# Patient Record
Sex: Male | Born: 1990 | Race: White | Hispanic: No | Marital: Single | Smoking: Never smoker
Health system: Southern US, Community
[De-identification: ages and names within clinical notes are randomized; demographics above are authoritative.]

## PROBLEM LIST (undated history)

## (undated) DIAGNOSIS — J45909 Unspecified asthma, uncomplicated: Secondary | ICD-10-CM

## (undated) DIAGNOSIS — Z889 Allergy status to unspecified drugs, medicaments and biological substances status: Secondary | ICD-10-CM

## (undated) HISTORY — DX: Allergy status to unspecified drugs, medicaments and biological substances: Z88.9

## (undated) HISTORY — PX: NO PAST SURGERIES: SHX2092

## (undated) HISTORY — DX: Unspecified asthma, uncomplicated: J45.909

---
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
## 2016-09-04 DIAGNOSIS — E038 Other specified hypothyroidism: Secondary | ICD-10-CM | POA: Diagnosis not present

## 2016-09-04 DIAGNOSIS — E784 Other hyperlipidemia: Secondary | ICD-10-CM | POA: Diagnosis not present

## 2016-09-04 DIAGNOSIS — Z0000 Encounter for general adult medical examination without abnormal findings: Secondary | ICD-10-CM | POA: Diagnosis not present

## 2016-09-11 DIAGNOSIS — E784 Other hyperlipidemia: Secondary | ICD-10-CM | POA: Diagnosis not present

## 2016-09-11 DIAGNOSIS — Z1389 Encounter for screening for other disorder: Secondary | ICD-10-CM | POA: Diagnosis not present

## 2016-09-11 DIAGNOSIS — R7301 Impaired fasting glucose: Secondary | ICD-10-CM | POA: Diagnosis not present

## 2016-09-11 DIAGNOSIS — Z0000 Encounter for general adult medical examination without abnormal findings: Secondary | ICD-10-CM | POA: Diagnosis not present

## 2016-09-11 DIAGNOSIS — R946 Abnormal results of thyroid function studies: Secondary | ICD-10-CM | POA: Diagnosis not present

## 2017-06-27 ENCOUNTER — Encounter (HOSPITAL_COMMUNITY): Payer: Self-pay | Admitting: Emergency Medicine

## 2017-06-27 ENCOUNTER — Ambulatory Visit (HOSPITAL_COMMUNITY)
Admission: EM | Admit: 2017-06-27 | Discharge: 2017-06-27 | Disposition: A | Discharge status: Home / Self Care | Payer: Commercial Managed Care - PPO | Attending: Emergency Medicine | Admitting: Emergency Medicine

## 2017-06-27 ENCOUNTER — Ambulatory Visit (INDEPENDENT_AMBULATORY_CARE_PROVIDER_SITE_OTHER): Payer: Commercial Managed Care - PPO

## 2017-06-27 DIAGNOSIS — R0781 Pleurodynia: Secondary | ICD-10-CM | POA: Diagnosis not present

## 2017-06-27 MED ORDER — NAPROXEN 500 MG PO TABS: 500.0000 mg | ORAL_TABLET | Freq: Two times a day (BID) | ORAL | 0 refills | Status: AC

## 2017-06-27 NOTE — Discharge Instructions (Signed)
Xray negative for rib fracture. Symptoms could be due to muscle strain. Start naproxen as directed. Warm compresses. This can take up to 3-4 weeks to completely resolve, but should be feeling better each week. Follow up with PCP for further evaluation if symptoms do not improve or resolve. If experiencing worsening of symptoms, chest pain, shortness of breath, trouble breathing, go to the emergency department for further evaluation.

## 2017-06-27 NOTE — ED Triage Notes (Signed)
Pt sts left sided rib pain x 2 weeks worse with cough; pt denies obvious injury

## 2017-06-27 NOTE — ED Provider Notes (Signed)
[facility]    CSN: [id number] Arrival date & time: 06/27/17  1813     History   Chief Complaint Chief Complaint  Patient presents with  . rib pain    HPI Jesse FallChristopher W Wilbourne is a 26 y.o. male.   26 year old male comes in for 2 week history of left sided rib pain that is worse with cough. Denies injury. Has had intermittent coughing throughout the 2 weeks, denies nasal congestion, sore throat, fever. Denies chest pain, shortness of breath, wheezing. Denies abdominal pain, nausea, vomiting. Patient cannot recall what he was doing when the pain first started. He states he recalls feeling the pain after work. Work requires strenuous activity and heavy lifting. States he can feel a bump on his ribs that is tender to palpation. Pain is constant and exacerbated from movement. He has not taken anything for the pain.       History reviewed. No pertinent past medical history.  There are no active problems to display for this patient.   History reviewed. No pertinent surgical history.     Home Medications    Prior to Admission medications   Medication Sig Start Date End Date Taking? Authorizing Provider  naproxen (NAPROSYN) 500 MG tablet Take 1 tablet (500 mg total) by mouth 2 (two) times daily. 06/27/17 07/07/17  Belinda FisherYu, Amy V, PA-C    Family History History reviewed. No pertinent family history.  Social History Social History  Substance Use Topics  . Smoking status: Not on file  . Smokeless tobacco: Never Used  . Alcohol use No     Allergies   Patient has no known allergies.   Review of Systems Review of Systems  Reason unable to perform ROS: See HPI as above.     Physical Exam Triage Vital Signs ED Triage Vitals [06/27/17 1842]  Enc Vitals Group     BP (!) 146/72     Pulse Rate 62     Resp 18     Temp 98.3 F (36.8 C)     Temp Source Oral     SpO2 100 %     Weight      Height      Head Circumference      Peak Flow      Pain Score 8   Pain Loc      Pain Edu?      Excl. in GC?    No data found.   Updated Vital Signs BP (!) 146/72 (BP Location: Right Arm)   Pulse 62   Temp 98.3 F (36.8 C) (Oral)   Resp 18   SpO2 100%   Physical Exam  Constitutional: He is oriented to person, place, and time. He appears well-developed and well-nourished. No distress.  HENT:  Head: Normocephalic and atraumatic.  Right Ear: Tympanic membrane, external ear and ear canal normal. Tympanic membrane is not erythematous and not bulging.  Left Ear: Tympanic membrane, external ear and ear canal normal. Tympanic membrane is not erythematous and not bulging.  Nose: Nose normal. Right sinus exhibits no maxillary sinus tenderness and no frontal sinus tenderness. Left sinus exhibits no maxillary sinus tenderness and no frontal sinus tenderness.  Mouth/Throat: Uvula is midline, oropharynx is clear and moist and mucous membranes are normal.  Eyes: Pupils are equal, round, and reactive to light. Conjunctivae are normal.  Neck: Normal range of motion. Neck supple.  Cardiovascular: Normal rate, regular rhythm and normal heart sounds.  Exam reveals no gallop and no  friction rub.   No murmur heard. Pulmonary/Chest: Effort normal and breath sounds normal. He has no decreased breath sounds. He has no wheezes. He has no rhonchi. He has no rales. He exhibits tenderness (No bruising, obvious swelling noted. Left anterior chest at about 6th rib. With small deformity felt. ).  Abdominal: Soft. Bowel sounds are normal. He exhibits no mass. There is no tenderness. There is no rebound and no guarding.  Lymphadenopathy:    He has no cervical adenopathy.  Neurological: He is alert and oriented to person, place, and time.  Skin: Skin is warm and dry.  Psychiatric: He has a normal mood and affect. His behavior is normal. Judgment normal.     UC Treatments / Results  Labs (all labs ordered are listed, but only abnormal results are displayed) Labs Reviewed - No  data to display  EKG  EKG Interpretation None       Radiology Dg Ribs Unilateral W/chest Left  Addendum Date: 06/27/2017   ADDENDUM REPORT: 06/27/2017 19:28 ADDENDUM: The third portion of the findings should read as follows: Normal appearing ribs. Electronically Signed   By: Beckie Salts M.D.   On: 06/27/2017 19:28   Result Date: 06/27/2017 CLINICAL DATA:  Left anterior rib pain and deformity without known injury. EXAM: LEFT RIBS AND CHEST - 3+ VIEW COMPARISON:  None. FINDINGS: Normal sized heart.  Clear lungs.  Normal appearing hips. IMPRESSION: Normal examination. Electronically Signed: By: Beckie Salts M.D. On: 06/27/2017 19:07    Procedures Procedures (including critical care time)  Medications Ordered in UC Medications - No data to display   Initial Impression / Assessment and Plan / UC Course  I have reviewed the triage vital signs and the nursing notes.  Pertinent labs & imaging results that were available during my care of the patient were reviewed by me and considered in my medical decision making (see chart for details).    Xray negative for rib fracture. Will treat for muscle strain/pain. NSAIDs as directed. Patient to follow up with PCP if symptoms do not resolve. Return precautions given.   Final Clinical Impressions(s) / UC Diagnoses   Final diagnoses:  Rib pain on left side    New Prescriptions Discharge Medication List as of 06/27/2017  7:18 PM    START taking these medications   Details  naproxen (NAPROSYN) 500 MG tablet Take 1 tablet (500 mg total) by mouth 2 (two) times daily., Starting Fri 06/27/2017, Until Mon 07/07/2017, Normal          Linward Headland V, PA-C 06/27/17 1957

## 2017-07-02 ENCOUNTER — Other Ambulatory Visit: Payer: Self-pay | Admitting: Internal Medicine

## 2017-07-02 DIAGNOSIS — R0781 Pleurodynia: Secondary | ICD-10-CM | POA: Diagnosis not present

## 2017-07-07 ENCOUNTER — Ambulatory Visit
Admission: RE | Admit: 2017-07-07 | Discharge: 2017-07-07 | Disposition: A | Discharge status: Home / Self Care | Payer: Commercial Managed Care - PPO | Source: Ambulatory Visit | Attending: Internal Medicine | Admitting: Internal Medicine

## 2017-07-07 DIAGNOSIS — S2232XA Fracture of one rib, left side, initial encounter for closed fracture: Secondary | ICD-10-CM | POA: Diagnosis not present

## 2017-07-07 DIAGNOSIS — R0781 Pleurodynia: Secondary | ICD-10-CM

## 2017-07-07 MED ORDER — IOPAMIDOL (ISOVUE-300) INJECTION 61%
75.0000 mL | Freq: Once | INTRAVENOUS | Status: AC | PRN
  Administered 2017-07-07: 75 mL via INTRAVENOUS

## 2017-08-13 ENCOUNTER — Other Ambulatory Visit (HOSPITAL_COMMUNITY): Payer: Self-pay | Admitting: Internal Medicine

## 2017-08-13 DIAGNOSIS — R9389 Abnormal findings on diagnostic imaging of other specified body structures: Secondary | ICD-10-CM

## 2017-08-13 DIAGNOSIS — R0781 Pleurodynia: Secondary | ICD-10-CM

## 2017-08-21 ENCOUNTER — Encounter (HOSPITAL_COMMUNITY)
Admission: RE | Admit: 2017-08-21 | Discharge: 2017-08-21 | Disposition: A | Discharge status: Home / Self Care | Payer: Commercial Managed Care - PPO | Source: Ambulatory Visit | Attending: Internal Medicine | Admitting: Internal Medicine

## 2017-08-21 DIAGNOSIS — R0781 Pleurodynia: Secondary | ICD-10-CM

## 2017-08-21 DIAGNOSIS — R9389 Abnormal findings on diagnostic imaging of other specified body structures: Secondary | ICD-10-CM | POA: Insufficient documentation

## 2017-08-21 MED ORDER — TECHNETIUM TC 99M MEDRONATE IV KIT
25.0000 | PACK | Freq: Once | INTRAVENOUS | Status: AC | PRN
  Administered 2017-08-21: 25 via INTRAVENOUS

## 2017-08-29 DIAGNOSIS — Z0000 Encounter for general adult medical examination without abnormal findings: Secondary | ICD-10-CM | POA: Diagnosis not present

## 2017-08-29 DIAGNOSIS — R7301 Impaired fasting glucose: Secondary | ICD-10-CM | POA: Diagnosis not present

## 2017-08-29 DIAGNOSIS — R82998 Other abnormal findings in urine: Secondary | ICD-10-CM | POA: Diagnosis not present

## 2017-09-16 DIAGNOSIS — E7849 Other hyperlipidemia: Secondary | ICD-10-CM | POA: Diagnosis not present

## 2017-09-16 DIAGNOSIS — R7301 Impaired fasting glucose: Secondary | ICD-10-CM | POA: Diagnosis not present

## 2017-09-16 DIAGNOSIS — Z0000 Encounter for general adult medical examination without abnormal findings: Secondary | ICD-10-CM | POA: Diagnosis not present

## 2017-09-16 DIAGNOSIS — R0781 Pleurodynia: Secondary | ICD-10-CM | POA: Diagnosis not present

## 2017-09-16 DIAGNOSIS — Z23 Encounter for immunization: Secondary | ICD-10-CM | POA: Diagnosis not present

## 2017-09-16 DIAGNOSIS — Z1389 Encounter for screening for other disorder: Secondary | ICD-10-CM | POA: Diagnosis not present

## 2017-10-02 ENCOUNTER — Encounter: Payer: Commercial Managed Care - PPO | Admitting: Cardiothoracic Surgery

## 2017-10-08 ENCOUNTER — Institutional Professional Consult (permissible substitution): Payer: Commercial Managed Care - PPO | Admitting: Cardiothoracic Surgery

## 2017-10-08 ENCOUNTER — Encounter: Payer: Self-pay | Admitting: Cardiothoracic Surgery

## 2017-10-08 VITALS — BP 130/86 | HR 80 | Resp 20 | Ht 70.0 in | Wt 172.0 lb

## 2017-10-08 DIAGNOSIS — R0781 Pleurodynia: Secondary | ICD-10-CM

## 2017-10-08 NOTE — Progress Notes (Signed)
[address]       [place] [address]             [phone]                    Marco Johnson [facility] Medical Record #[id number] Date of Birth: Nov 18, 1990  Referring: Martha Clan, MD Primary Care: Martha Clan, MD Primary Cardiologist: No primary care provider on file.  Chief Complaint:    Chief Complaint  Patient presents with  . Chest Pain    Surgical eval, Whole body scan 08/21/17, Chest CT 07/07/17    History of Present Illness:    Marco Johnson 27 y.o. male is seen in the office  today for left lower chest rib pain.  The patient denies any known specific trauma to the area.  Patient was seen in the fall 2018 primary care complaining of some tenderness soreness in question of a knot area over the left lower rib the pain does not radiate.  CT scan and bone scan were done previously and suggested rib fracture.  The patient has continued to notice discomfort but really has not gone away over several months.  Patient did not feel any popping or clicking.  He has been doing physical work with a Dealer.   He does have a family history in his mother with a sarcoma in her leg years ago   Current Activity/ Functional Status:  Patient is independent with mobility/ambulation, transfers, ADL's, IADL's.   Zubrod Score: At the time of surgery this patient's most appropriate activity status/level should be described as: [x]     0    Normal activity, no symptoms []     1    Restricted in physical strenuous activity but ambulatory, able to do out light work []     2    Ambulatory and capable of self care, unable to do work activities, up and about               >50 % of waking hours                              []     3    Only limited self care, in bed greater than 50% of waking hours []     4    Completely disabled, no self care, confined to bed or chair []     5    Moribund   Past Medical History:  Diagnosis Date  . Asthma   . H/O  seasonal allergies     Past Surgical History:  Procedure Laterality Date  . NO PAST SURGERIES      Family History  Problem Relation Age of Onset  . Cancer Mother   . Diabetes Father     Social History   Socioeconomic History  . Marital status: Single    Spouse name: Not on file  . Number of children: Not on file  . Years of education: Not on file  . Highest education level: Not on file  Social Needs  . Financial resource strain: Not on file  . Food insecurity - worry: Not on file  . Food insecurity - inability: Not on file  . Transportation needs - medical: Not on file  . Transportation needs - non-medical: Not on file  Occupational History  . Occupation: Emergency planning/management officer  Tobacco Use  . Smoking status: Never Smoker  .  Smokeless tobacco: Never Used  Substance and Sexual Activity  . Alcohol use: Yes  . Drug use: No  . Sexual activity: Not on file  Other Topics Concern  . Not on file  Social History Narrative  . Not on file    Social History   Tobacco Use  Smoking Status Never Smoker  Smokeless Tobacco Never Used    Social History   Substance and Sexual Activity  Alcohol Use Yes     No Known Allergies  Current Outpatient Medications  Medication Sig Dispense Refill  . amphetamine-dextroamphetamine (ADDERALL) 10 MG tablet Take 10 mg by mouth 2 (two) times daily as needed.    Marland Kitchen VYVANSE 60 MG capsule      No current facility-administered medications for this visit.     Pertinent items are noted in HPI.   Review of Systems:     Cardiac Review of Systems: [Y] = yes  or   [  ] = no   Chest Pain [rib pain    ]  Resting SOB [ n  ] Exertional SOB  [n  ]  Orthopnea [ n ]   Pedal Edema [n   ]    Palpitations [ n ] Syncope  [n  ]   Presyncope [ n  ]  General Review of Systems: [Y] = yes [  ]=no Constitional: recent weight change [ n ];  Wt loss over the last 3 months [ n  ] anorexia [n  ]; fatigue [ n ]; nausea [  n]; night sweats [ n]; fever [n  ]; or chills  [ n ];          Dental: poor dentition[  ]; Last Dentist visit:   Eye : blurred vision [  ]; diplopia [   ]; vision changes [  ];  Amaurosis fugax[  ]; Resp: cough [ n ];  wheezing[n  ];  hemoptysis[n  ]; shortness of breath[ n ]; paroxysmal nocturnal dyspnea[  n]; dyspnea on exertion[  ]; or orthopnea[  ];  GI:  gallstones[n  ], vomiting[  ];  dysphagia[  ]; melena[  ];  hematochezia [  ]; heartburn[  ];   Hx of  Colonoscopy[  ]; GU: kidney stones [  ]; hematuria[ n ];   dysuria [n  ];  nocturia[  ];  history of     obstruction [  ]; urinary frequency [  ]             Skin: rash, swelling[n  ];, hair loss[ n ];  peripheral edema[ n ];  or itching[  ]; Musculosketetal: myalgias[ n ];  joint swelling[n  ];  joint erythema[n  ];  joint pain[  ];  back pain[  n];  Heme/Lymph: bruising[  ];  bleeding[  ];  anemia[  ];  Neuro: TIA[ n ];  headaches[n  ];  stroke[ n ];  vertigo[ n ];  seizures[ n ];   paresthesias[  ];  difficulty walking[  ];  Psych:depression[ n ]; anxiety[n  ];  Endocrine: diabetes[n  ];  thyroid dysfunction[ n ];  Immunizations: Flu up to date [  y]; Pneumococcal up to date [n  ];  Other:  Physical Exam: BP 130/86   Pulse 80   Resp 20   Ht 5\' 10"  (1.778 m)   Wt 172 lb (78 kg)   SpO2 99% Comment: RA  BMI 24.68 kg/m   PHYSICAL EXAMINATION: General appearance: alert and cooperative Head: Normocephalic,  without obvious abnormality, atraumatic Neck: no adenopathy, no carotid bruit, no JVD, supple, symmetrical, trachea midline and thyroid not enlarged, symmetric, no tenderness/mass/nodules Lymph nodes: Cervical, supraclavicular, and axillary nodes normal. Resp: clear to auscultation bilaterally Back: symmetric, no curvature. ROM normal. No CVA tenderness. Cardio: regular rate and rhythm, S1, S2 normal, no murmur, click, rub or gallop GI: soft, non-tender; bowel sounds normal; no masses,  no organomegaly Extremities: extremities normal, atraumatic, no cyanosis or  edema Neurologic: Grossly normal On examination of the rib cage there is no definite mass or movement appreciated in the rib  Diagnostic Studies & Laboratory data:   CLINICAL DATA:  27 y/o male with 3-4 weeks of left side rib pain and palpable abnormality. No known injury. Subsequent encounter.  EXAM: CT CHEST WITH CONTRAST  TECHNIQUE: Multidetector CT imaging of the chest was performed during intravenous contrast administration.  CONTRAST:  75mL ISOVUE-300 IOPAMIDOL (ISOVUE-300) INJECTION 61%  COMPARISON:  06/27/2017 chest and left rib radiographs.  FINDINGS: Cardiovascular: No significant vascular findings. Normal heart size. No pericardial effusion.  Mediastinum/Nodes: No enlarged mediastinal, hilar, or axillary lymph nodes. Thyroid gland, trachea, and esophagus demonstrate no significant findings.  Lungs/Pleura: Major airways are patent. Both lungs are clear. No pleural effusion. Incidental small right lower low 12 mm no mass cyst (series 5, image 75), normal variant).  Upper Abdomen: Circumscribed hypodense area in the right hepatic lobe on series 2, image 144 has density above that of simple fluid. This is probably a benign hemangioma. Other visible upper abdominal viscera appear normal, including in the left upper quadrant. Normal spleen. No upper abdominal free fluid.  Musculoskeletal: Palpable area of concern marked along the left lower ribs corresponding to the left sixth costochondral margin. There is no soft tissue mass or inflammation in the region of the skin marker. However, just caudal to the skin marker there is a discontinuity at the left anterior seventh costochondral junction (series 2 image 153 and series 5, image 153) which demonstrates some dystrophic calcification and appears mildly raised such as this would likely be palpable. Still there is no inflammation in the region.  Underlying normal bone mineralization. No displaced osseous  rib fracture. Intact sternum. Negative visualized spinal vertebrae.  IMPRESSION: 1. Evidence of a left anterior seventh rib costochondral cartilage fracture, appearing subacute or chronic. Legrand RamsFavor this corresponds to the palpable area of concern. No osseous rib fracture or other regional costochondral or chest wall abnormality identified. 2. A 12 mm hypodense area in the posterior right hepatic lobe is most likely benign, such as a hemangioma. Right Upper Quadrant Ultrasound may be the simplest way to confirm. 3. Otherwise negative CT appearance of the chest.   Electronically Signed   By: Odessa FlemingH  Hall M.D.   On: 07/07/2017 09:53  ADDENDUM REPORT: 06/27/2017 19:28  ADDENDUM: The third portion of the findings should read as follows: Normal appearing ribs.   Electronically Signed   By: Beckie SaltsSteven  Reid M.D.   On: 06/27/2017 19:28   Addended by Gordan Paymenteid, Steve, MD on 06/27/2017 7:30 PM    Study Result   CLINICAL DATA:  Left anterior rib pain and deformity without known injury.  EXAM: LEFT RIBS AND CHEST - 3+ VIEW  COMPARISON:  None.  FINDINGS: Normal sized heart.  Clear lungs.  Normal appearing hips.  IMPRESSION: Normal examination.  Electronically Signed: By: Beckie SaltsSteven  Reid M.D. On: 06/27/2017 19:07      CLINICAL DATA:  27 year old male with left anterior rib pain. Abnormal chest CT demonstrating left anterior  seventh rib costochondral cartilage fracture.  EXAM: NUCLEAR MEDICINE WHOLE BODY BONE SCAN  TECHNIQUE: Whole body anterior and posterior images were obtained approximately 3 hours after intravenous injection of radiopharmaceutical.  RADIOPHARMACEUTICALS:  21.3 mCi Technetium-80m MDP IV  COMPARISON:  07/07/2017 chest CT  FINDINGS: A small focal area of increased activity in the region of the anterior left seventh rib is compatible with subacute to remote costochondral fracture as identified on recent CT.  There is minimal slight increased  activity within the proximal left fibula which may represent physiologic uptake or very mild stress reaction.  No other abnormalities are noted.  IMPRESSION: 1. Small area of increased activity in the region of the anterior left seventh rib compatible with subacute to remote costochondral fracture as identified on recent CT. 2. Equivocal minimal increased activity within the proximal left fibula -question physiologic uptake or very mild stress reaction. Correlate with pain.   Electronically Signed   By: Harmon Pier M.D.   On: 08/21/2017 17:30  I have independently reviewed the above radiology studies  and reviewed the findings with the patient.     Recent Lab Findings: No results found for: WBC, HGB, HCT, PLT, GLUCOSE, CHOL, TRIG, HDL, LDLDIRECT, LDLCALC, ALT, AST, NA, K, CL, CREATININE, BUN, CO2, TSH, INR, GLUF, HGBA1C    Assessment / Plan:   Several months of left lower rib pain with a small area of increased uptake on bone scan, and evidence of costochondral fracture on the left on CT scan done in November 2018.  This on the scan that did not appeared to be in a enlarging mass suspicious for chondroma or chondrosarcoma.  I reviewed the films with the patient would not recommend any interventional procedure at this point, we discussed having a follow-up limited CT scan of the area and concern approximately 3 months after the 3-4 months after the previous scan which would be late February. Patient is agreeable with this approach and will return with a limited CT scan With area in question marked in several weeks        Delight Ovens MD      [address] [place].[address] [place] [address] Office [phone]   Beeper [phone]  10/08/2017 4:40 PM

## 2017-10-09 ENCOUNTER — Other Ambulatory Visit: Payer: Self-pay | Admitting: *Deleted

## 2017-10-09 ENCOUNTER — Encounter: Payer: Commercial Managed Care - PPO | Admitting: Cardiothoracic Surgery

## 2017-10-09 DIAGNOSIS — R079 Chest pain, unspecified: Secondary | ICD-10-CM

## 2017-10-28 ENCOUNTER — Encounter: Payer: Commercial Managed Care - PPO | Admitting: Cardiothoracic Surgery

## 2017-11-06 ENCOUNTER — Other Ambulatory Visit: Payer: Self-pay

## 2017-11-06 ENCOUNTER — Ambulatory Visit
Admission: RE | Admit: 2017-11-06 | Discharge: 2017-11-06 | Disposition: A | Discharge status: Home / Self Care | Payer: Commercial Managed Care - PPO | Source: Ambulatory Visit | Attending: Cardiothoracic Surgery | Admitting: Cardiothoracic Surgery

## 2017-11-06 ENCOUNTER — Encounter: Payer: Self-pay | Admitting: Cardiothoracic Surgery

## 2017-11-06 ENCOUNTER — Ambulatory Visit: Payer: Commercial Managed Care - PPO | Admitting: Cardiothoracic Surgery

## 2017-11-06 ENCOUNTER — Other Ambulatory Visit: Payer: Self-pay | Admitting: Cardiothoracic Surgery

## 2017-11-06 VITALS — BP 118/75 | HR 62 | Resp 18 | Ht 70.0 in | Wt 181.0 lb

## 2017-11-06 DIAGNOSIS — R079 Chest pain, unspecified: Secondary | ICD-10-CM

## 2017-11-06 DIAGNOSIS — R0781 Pleurodynia: Secondary | ICD-10-CM | POA: Diagnosis not present

## 2017-11-06 NOTE — Progress Notes (Signed)
[address]       [place] [address]             [phone]                    Marco Johnson [facility] Medical Record #[id number] Date of Birth: [birth year]  Referring: Martha Clan, MD Primary Care: Martha Clan, MD Primary Cardiologist: No primary care provider on file.  Chief Complaint:    Chief Complaint  Patient presents with  . Chest Pain    f/u with chest CT today    History of Present Illness:    Marco Johnson 27 y.o. male is seen in the office  today for left lower chest rib pain.  A follow-up CT scan limited of the chest was done . the patient denies any known specific trauma to the area.  Patient was seen in the fall 2018 primary care complaining of some tenderness soreness in question of a knot area over the left lower rib the pain does not radiate.  CT scan and bone scan were done previously and suggested rib fracture.  The patient has continued to notice discomfort but really has not gone away over several months.  Patient did not feel any popping or clicking.  He has been doing physical work with a Deal[employer].   Since last seen patient still has discomfort over the left costal chondral junctions he notes that he has not disappeared but has not gotten worse either.   He does have a family history in his mother with a sarcoma in her leg years ago   Current Activity/ Functional Status:  Patient is independent with mobility/ambulation, transfers, ADL's, IADL's.   Zubrod Score: At the time of surgery this patient's most appropriate activity status/level should be described as: [x]     0    Normal activity, no symptoms []     1    Restricted in physical strenuous activity but ambulatory, able to do out light work []     2    Ambulatory and capable of self care, unable to do work activities, up and about               >50 % of waking hours                              []     3    Only limited self care, in bed greater than  50% of waking hours []     4    Completely disabled, no self care, confined to bed or chair []     5    Moribund   Past Medical History:  Diagnosis Date  . Asthma   . H/O seasonal allergies     Past Surgical History:  Procedure Laterality Date  . NO PAST SURGERIES      Family History  Problem Relation Age of Onset  . Cancer Mother   . Diabetes Father     Social History   Socioeconomic History  . Marital status: Single    Spouse name: Not on file  . Number of children: Not on file  . Years of education: Not on file  . Highest education level: Not on file  Social Needs  . Financial resource strain: Not on file  . Food insecurity - worry: Not on file  . Food insecurity - inability: Not on file  .  Transportation needs - medical: Not on file  . Transportation needs - non-medical: Not on file  Occupational History  . Occupation: Emergency planning/management officer  Tobacco Use  . Smoking status: Never Smoker  . Smokeless tobacco: Never Used  Substance and Sexual Activity  . Alcohol use: Yes  . Drug use: No  . Sexual activity: Not on file  Other Topics Concern  . Not on file  Social History Narrative  . Not on file    Social History   Tobacco Use  Smoking Status Never Smoker  Smokeless Tobacco Never Used    Social History   Substance and Sexual Activity  Alcohol Use Yes     No Known Allergies  Current Outpatient Medications  Medication Sig Dispense Refill  . amphetamine-dextroamphetamine (ADDERALL) 10 MG tablet Take 10 mg by mouth 2 (two) times daily as needed.    Marland Kitchen VYVANSE 60 MG capsule      No current facility-administered medications for this visit.     Pertinent items are noted in HPI.   Review of Systems:  Review of Systems  Constitutional: Negative.   HENT: Negative.   Eyes: Negative.   Respiratory: Negative.   Cardiovascular: Positive for chest pain. Negative for palpitations, orthopnea, claudication, leg swelling and PND.  Gastrointestinal: Negative.     Genitourinary: Negative.   Musculoskeletal: Negative.   Skin: Negative.   Neurological: Negative.   Endo/Heme/Allergies: Negative.   Psychiatric/Behavioral: Negative.     Immunizations: Flu up to date [  y]; Pneumococcal up to date [n  ];    PHYSICAL EXAMINATION: BP 118/75 (BP Location: Right Arm, Patient Position: Sitting, Cuff Size: Large)   Pulse 62   Resp 18   Ht 5\' 10"  (1.778 m)   Wt 181 lb (82.1 kg)   SpO2 99% Comment: RA  BMI 25.97 kg/m  General appearance: alert, cooperative, appears stated age and no distress Head: Normocephalic, without obvious abnormality, atraumatic Neck: no adenopathy, no carotid bruit, no JVD, supple, symmetrical, trachea midline and thyroid not enlarged, symmetric, no tenderness/mass/nodules Lymph nodes: Cervical, supraclavicular, and axillary nodes normal. Resp: clear to auscultation bilaterally Back: symmetric, no curvature. ROM normal. No CVA tenderness. Cardio: regular rate and rhythm, S1, S2 normal, no murmur, click, rub or gallop GI: soft, non-tender; bowel sounds normal; no masses,  no organomegaly Extremities: extremities normal, atraumatic, no cyanosis or edema Neurologic: Grossly normal  On examination of the rib cage there is no definite mass or movement appreciated in the rib  Diagnostic Studies & Laboratory data:  Ct Chest Limited Wo Contrast  Result Date: 11/06/2017 CLINICAL DATA:  Chest pain, evaluate for osteochondritis of left lower ribs. EXAM: CT CHEST WITHOUT CONTRAST TECHNIQUE: Multidetector CT imaging of the lower chest was performed without IV contrast. COMPARISON:  07/07/2017 FINDINGS: Cardiovascular: Heart is normal size. Visualized aorta is normal caliber. Mediastinum/Nodes: No adenopathy in the lower mediastinum or hila. Lungs/Pleura: Visualized mid and lower lungs are clear. No effusions. Upper Abdomen: Imaging into the upper abdomen shows no acute findings. Musculoskeletal: No acute bony abnormality. No abnormality  noted in the anterior left lower ribs. Costochondral junctions have a normal CT appearance. IMPRESSION: No acute cardiopulmonary disease. No visible focal rib lesion or acute bony abnormality. Electronically Signed   By: Charlett Nose M.D.   On: 11/06/2017 11:36   I have independently reviewed the above radiology studies  and reviewed the findings with the patient.     CLINICAL DATA:  27 y/o male with 3-4 weeks of left side  rib pain and palpable abnormality. No known injury. Subsequent encounter.  EXAM: CT CHEST WITH CONTRAST  TECHNIQUE: Multidetector CT imaging of the chest was performed during intravenous contrast administration.  CONTRAST:  75mL ISOVUE-300 IOPAMIDOL (ISOVUE-300) INJECTION 61%  COMPARISON:  06/27/2017 chest and left rib radiographs.  FINDINGS: Cardiovascular: No significant vascular findings. Normal heart size. No pericardial effusion.  Mediastinum/Nodes: No enlarged mediastinal, hilar, or axillary lymph nodes. Thyroid gland, trachea, and esophagus demonstrate no significant findings.  Lungs/Pleura: Major airways are patent. Both lungs are clear. No pleural effusion. Incidental small right lower low 12 mm no mass cyst (series 5, image 75), normal variant).  Upper Abdomen: Circumscribed hypodense area in the right hepatic lobe on series 2, image 144 has density above that of simple fluid. This is probably a benign hemangioma. Other visible upper abdominal viscera appear normal, including in the left upper quadrant. Normal spleen. No upper abdominal free fluid.  Musculoskeletal: Palpable area of concern marked along the left lower ribs corresponding to the left sixth costochondral margin. There is no soft tissue mass or inflammation in the region of the skin marker. However, just caudal to the skin marker there is a discontinuity at the left anterior seventh costochondral junction (series 2 image 153 and series 5, image 153) which demonstrates  some dystrophic calcification and appears mildly raised such as this would likely be palpable. Still there is no inflammation in the region.  Underlying normal bone mineralization. No displaced osseous rib fracture. Intact sternum. Negative visualized spinal vertebrae.  IMPRESSION: 1. Evidence of a left anterior seventh rib costochondral cartilage fracture, appearing subacute or chronic. Legrand RamsFavor this corresponds to the palpable area of concern. No osseous rib fracture or other regional costochondral or chest wall abnormality identified. 2. A 12 mm hypodense area in the posterior right hepatic lobe is most likely benign, such as a hemangioma. Right Upper Quadrant Ultrasound may be the simplest way to confirm. 3. Otherwise negative CT appearance of the chest.   Electronically Signed   By: Odessa FlemingH  Hall M.D.   On: 07/07/2017 09:53  ADDENDUM REPORT: 06/27/2017 19:28  ADDENDUM: The third portion of the findings should read as follows: Normal appearing ribs.   Electronically Signed   By: Beckie SaltsSteven  Reid M.D.   On: 06/27/2017 19:28   Addended by Gordan Paymenteid, Steve, MD on 06/27/2017 7:30 PM    Study Result   CLINICAL DATA:  Left anterior rib pain and deformity without known injury.  EXAM: LEFT RIBS AND CHEST - 3+ VIEW  COMPARISON:  None.  FINDINGS: Normal sized heart.  Clear lungs.  Normal appearing hips.  IMPRESSION: Normal examination.  Electronically Signed: By: Beckie SaltsSteven  Reid M.D. On: 06/27/2017 19:07      CLINICAL DATA:  27 year old male with left anterior rib pain. Abnormal chest CT demonstrating left anterior seventh rib costochondral cartilage fracture.  EXAM: NUCLEAR MEDICINE WHOLE BODY BONE SCAN  TECHNIQUE: Whole body anterior and posterior images were obtained approximately 3 hours after intravenous injection of radiopharmaceutical.  RADIOPHARMACEUTICALS:  21.3 mCi Technetium-3453m MDP IV  COMPARISON:  07/07/2017 chest CT  FINDINGS: A small  focal area of increased activity in the region of the anterior left seventh rib is compatible with subacute to remote costochondral fracture as identified on recent CT.  There is minimal slight increased activity within the proximal left fibula which may represent physiologic uptake or very mild stress reaction.  No other abnormalities are noted.  IMPRESSION: 1. Small area of increased activity in the region of the anterior  left seventh rib compatible with subacute to remote costochondral fracture as identified on recent CT. 2. Equivocal minimal increased activity within the proximal left fibula -question physiologic uptake or very mild stress reaction. Correlate with pain.   Electronically Signed   By: Harmon Pier M.D.   On: 08/21/2017 17:30  I have independently reviewed the above radiology studies  and reviewed the findings with the patient.     Recent Lab Findings: No results found for: WBC, HGB, HCT, PLT, GLUCOSE, CHOL, TRIG, HDL, LDLDIRECT, LDLCALC, ALT, AST, NA, K, CL, CREATININE, BUN, CO2, TSH, INR, GLUF, HGBA1C    Assessment / Plan:   Several months of left lower rib pain with a small area of increased uptake on bone scan, and evidence of costochondral fracture on the left on CT scan done in November 2018.  Follow-up limited scan now done, 5 months later shows no abnormality in the area of discomfort.  At this point we we will not plan any specific surgical intervention.  I will see the patient back in 3 months for follow-up visit    Delight Ovens MD      [address] [place] [place] [address] Office [phone]   Beeper [phone]  11/06/2017 12:23 PM

## 2018-02-05 ENCOUNTER — Encounter: Payer: Commercial Managed Care - PPO | Admitting: Cardiothoracic Surgery

## 2018-02-11 ENCOUNTER — Encounter: Payer: Commercial Managed Care - PPO | Admitting: Cardiothoracic Surgery

## 2018-02-24 ENCOUNTER — Encounter: Payer: Commercial Managed Care - PPO | Admitting: Cardiothoracic Surgery

## 2018-02-27 ENCOUNTER — Encounter: Payer: Self-pay | Admitting: Cardiothoracic Surgery

## 2018-02-27 ENCOUNTER — Ambulatory Visit: Payer: Commercial Managed Care - PPO | Admitting: Cardiothoracic Surgery

## 2018-02-27 VITALS — BP 126/74 | HR 72 | Resp 20 | Ht 70.0 in | Wt 180.0 lb

## 2018-02-27 DIAGNOSIS — R0781 Pleurodynia: Secondary | ICD-10-CM | POA: Diagnosis not present

## 2018-02-27 NOTE — Progress Notes (Signed)
[address]       [place] [address]             [phone]                    PLEAS CARNEAL [facility] Medical Record #[id number] Date of Birth: 1990-11-26  Referring: Martha Clan, MD Primary Care: Martha Clan, MD Primary Cardiologist: No primary care provider on file.  Chief Complaint:    Chief Complaint  Patient presents with  . Follow-up    3 month f/u left lower chest rib pain    History of Present Illness:    Marco Johnson 27 y.o. male is seen in the office  today for follow-up left lower chest rib pain.  A follow-up CT scan limited of the chest has been done before the last visit..The  patient denies any known specific trauma to the area.  Patient was seen in the fall 2018 primary care complaining of some tenderness soreness in question of a knot area over the left lower rib the pain does not radiate.  CT scan and bone scan were done previously and suggested rib fracture.    Patient notes that he still has some slight discomfort over the left lower rib cage, says it "has a mind of its own", i.e. not necessarily related to activity.  He notes no association that either relieves it or causes it to return.  He says at times he is unaware of it.  There is no popping or clicking   He does have a family history in his mother with a sarcoma in her leg years ago   Current Activity/ Functional Status:  Patient is independent with mobility/ambulation, transfers, ADL's, IADL's.   Zubrod Score: At the time of surgery this patient's most appropriate activity status/level should be described as: [x]     0    Normal activity, no symptoms []     1    Restricted in physical strenuous activity but ambulatory, able to do out light work []     2    Ambulatory and capable of self care, unable to do work activities, up and about               >50 % of waking hours                              []     3    Only limited self care, in bed greater than 50% of  waking hours []     4    Completely disabled, no self care, confined to bed or chair []     5    Moribund   Past Medical History:  Diagnosis Date  . Asthma   . H/O seasonal allergies     Past Surgical History:  Procedure Laterality Date  . NO PAST SURGERIES      Family History  Problem Relation Age of Onset  . Cancer Mother   . Diabetes Father     Social History   Socioeconomic History  . Marital status: Single    Spouse name: Not on file  . Number of children: Not on file  . Years of education: Not on file  . Highest education level: Not on file  Occupational History  . Occupation: Emergency planning/management officer  Social Needs  . Financial resource strain: Not on file  . Food insecurity:  Worry: Not on file    Inability: Not on file  . Transportation needs:    Medical: Not on file    Non-medical: Not on file  Tobacco Use  . Smoking status: Never Smoker  . Smokeless tobacco: Never Used  Substance and Sexual Activity  . Alcohol use: Yes  . Drug use: No  . Sexual activity: Not on file  Lifestyle  . Physical activity:    Days per week: Not on file    Minutes per session: Not on file  . Stress: Not on file  Relationships  . Social connections:    Talks on phone: Not on file    Gets together: Not on file    Attends religious service: Not on file    Active member of club or organization: Not on file    Attends meetings of clubs or organizations: Not on file    Relationship status: Not on file  . Intimate partner violence:    Fear of current or ex partner: Not on file    Emotionally abused: Not on file    Physically abused: Not on file    Forced sexual activity: Not on file  Other Topics Concern  . Not on file  Social History Narrative  . Not on file    Social History   Tobacco Use  Smoking Status Never Smoker  Smokeless Tobacco Never Used    Social History   Substance and Sexual Activity  Alcohol Use Yes     No Known Allergies  Current Outpatient  Medications  Medication Sig Dispense Refill  . amphetamine-dextroamphetamine (ADDERALL) 10 MG tablet Take 10 mg by mouth 2 (two) times daily as needed.    Marland Kitchen VYVANSE 60 MG capsule      No current facility-administered medications for this visit.     Pertinent items are noted in HPI.   Review of Systems:  Review of Systems  Constitutional: Negative.   HENT: Negative.   Eyes: Negative.   Respiratory: Negative.   Gastrointestinal: Negative.   Genitourinary: Negative.   Musculoskeletal: Negative.   Skin: Negative.   Neurological: Negative.   Endo/Heme/Allergies: Negative.   Psychiatric/Behavioral: Negative.      Immunizations: Flu up to date [  y]; Pneumococcal up to date [n  ];    PHYSICAL EXAMINATION: BP 126/74   Pulse 72   Resp 20   Ht 5\' 10"  (1.778 m)   Wt 180 lb (81.6 kg)   SpO2 100% Comment: RA  BMI 25.83 kg/m  General appearance: alert and cooperative Head: Normocephalic, without obvious abnormality, atraumatic Neck: no adenopathy, no carotid bruit, no JVD, supple, symmetrical, trachea midline and thyroid not enlarged, symmetric, no tenderness/mass/nodules Lymph nodes: Cervical, supraclavicular, and axillary nodes normal. Resp: clear to auscultation bilaterally Back: symmetric, no curvature. ROM normal. No CVA tenderness. Cardio: regular rate and rhythm, S1, S2 normal, no murmur, click, rub or gallop GI: soft, non-tender; bowel sounds normal; no masses,  no organomegaly Extremities: extremities normal, atraumatic, no cyanosis or edema and Homans sign is negative, no sign of DVT Neurologic: Grossly normal On exam and of the rib cage there is no palpable mass over the ribs particularly on the left lower rib cage.  Diagnostic Studies & Laboratory data:  No results found. I have independently reviewed the above radiology studies  and reviewed the findings with the patient.     CLINICAL DATA:  27 y/o male with 3-4 weeks of left side rib pain and palpable  abnormality. No known  injury. Subsequent encounter.  EXAM: CT CHEST WITH CONTRAST  TECHNIQUE: Multidetector CT imaging of the chest was performed during intravenous contrast administration.  CONTRAST:  75mL ISOVUE-300 IOPAMIDOL (ISOVUE-300) INJECTION 61%  COMPARISON:  06/27/2017 chest and left rib radiographs.  FINDINGS: Cardiovascular: No significant vascular findings. Normal heart size. No pericardial effusion.  Mediastinum/Nodes: No enlarged mediastinal, hilar, or axillary lymph nodes. Thyroid gland, trachea, and esophagus demonstrate no significant findings.  Lungs/Pleura: Major airways are patent. Both lungs are clear. No pleural effusion. Incidental small right lower low 12 mm no mass cyst (series 5, image 75), normal variant).  Upper Abdomen: Circumscribed hypodense area in the right hepatic lobe on series 2, image 144 has density above that of simple fluid. This is probably a benign hemangioma. Other visible upper abdominal viscera appear normal, including in the left upper quadrant. Normal spleen. No upper abdominal free fluid.  Musculoskeletal: Palpable area of concern marked along the left lower ribs corresponding to the left sixth costochondral margin. There is no soft tissue mass or inflammation in the region of the skin marker. However, just caudal to the skin marker there is a discontinuity at the left anterior seventh costochondral junction (series 2 image 153 and series 5, image 153) which demonstrates some dystrophic calcification and appears mildly raised such as this would likely be palpable. Still there is no inflammation in the region.  Underlying normal bone mineralization. No displaced osseous rib fracture. Intact sternum. Negative visualized spinal vertebrae.  IMPRESSION: 1. Evidence of a left anterior seventh rib costochondral cartilage fracture, appearing subacute or chronic. Legrand RamsFavor this corresponds to the palpable area of concern.  No osseous rib fracture or other regional costochondral or chest wall abnormality identified. 2. A 12 mm hypodense area in the posterior right hepatic lobe is most likely benign, such as a hemangioma. Right Upper Quadrant Ultrasound may be the simplest way to confirm. 3. Otherwise negative CT appearance of the chest.   Electronically Signed   By: Odessa FlemingH  Hall M.D.   On: 07/07/2017 09:53  ADDENDUM REPORT: 06/27/2017 19:28  ADDENDUM: The third portion of the findings should read as follows: Normal appearing ribs.   Electronically Signed   By: Beckie SaltsSteven  Reid M.D.   On: 06/27/2017 19:28   Addended by Gordan Paymenteid, Steve, MD on 06/27/2017 7:30 PM    Study Result   CLINICAL DATA:  Left anterior rib pain and deformity without known injury.  EXAM: LEFT RIBS AND CHEST - 3+ VIEW  COMPARISON:  None.  FINDINGS: Normal sized heart.  Clear lungs.  Normal appearing hips.  IMPRESSION: Normal examination.  Electronically Signed: By: Beckie SaltsSteven  Reid M.D. On: 06/27/2017 19:07      CLINICAL DATA:  27 year old male with left anterior rib pain. Abnormal chest CT demonstrating left anterior seventh rib costochondral cartilage fracture.  EXAM: NUCLEAR MEDICINE WHOLE BODY BONE SCAN  TECHNIQUE: Whole body anterior and posterior images were obtained approximately 3 hours after intravenous injection of radiopharmaceutical.  RADIOPHARMACEUTICALS:  21.3 mCi Technetium-9330m MDP IV  COMPARISON:  07/07/2017 chest CT  FINDINGS: A small focal area of increased activity in the region of the anterior left seventh rib is compatible with subacute to remote costochondral fracture as identified on recent CT.  There is minimal slight increased activity within the proximal left fibula which may represent physiologic uptake or very mild stress reaction.  No other abnormalities are noted.  IMPRESSION: 1. Small area of increased activity in the region of the anterior left seventh rib  compatible with subacute to  remote costochondral fracture as identified on recent CT. 2. Equivocal minimal increased activity within the proximal left fibula -question physiologic uptake or very mild stress reaction. Correlate with pain.   Electronically Signed   By: Harmon Pier M.D.   On: 08/21/2017 17:30  I have independently reviewed the above radiology studies  and reviewed the findings with the patient.     Recent Lab Findings: No results found for: WBC, HGB, HCT, PLT, GLUCOSE, CHOL, TRIG, HDL, LDLDIRECT, LDLCALC, ALT, AST, NA, K, CL, CREATININE, BUN, CO2, TSH, INR, GLUF, HGBA1C    Assessment / Plan:   8 months of intermittent left lower rib pain with a small area of increased uptake on bone scan, and evidence of costochondral fracture on the left on CT scan done in November 2018.  Follow-up limited scan  5 months later shows no abnormality in the area of discomfort.  On exam today there is no change.  I would not recommend any specific surgical intervention.  I will see the patient back in 4 to 5 months months for follow-up visit    Delight Ovens MD      [address] [place] [place] [address] Office [phone]   Beeper [phone]  02/27/2018 1:40 PM

## 2018-07-09 ENCOUNTER — Other Ambulatory Visit: Payer: Self-pay | Admitting: Cardiothoracic Surgery

## 2018-07-09 ENCOUNTER — Other Ambulatory Visit: Payer: Self-pay

## 2018-07-09 ENCOUNTER — Ambulatory Visit: Payer: Commercial Managed Care - PPO | Admitting: Cardiothoracic Surgery

## 2018-07-09 ENCOUNTER — Ambulatory Visit
Admission: RE | Admit: 2018-07-09 | Discharge: 2018-07-09 | Disposition: A | Discharge status: Home / Self Care | Payer: Commercial Managed Care - PPO | Source: Ambulatory Visit | Attending: Cardiothoracic Surgery | Admitting: Cardiothoracic Surgery

## 2018-07-09 VITALS — BP 130/76 | HR 86 | Resp 20 | Ht 70.0 in | Wt 187.0 lb

## 2018-07-09 DIAGNOSIS — R071 Chest pain on breathing: Principal | ICD-10-CM

## 2018-07-09 DIAGNOSIS — R0789 Other chest pain: Secondary | ICD-10-CM

## 2018-07-09 DIAGNOSIS — R079 Chest pain, unspecified: Secondary | ICD-10-CM | POA: Diagnosis not present

## 2018-07-09 NOTE — Progress Notes (Addendum)
[address]       [place] [address]             [phone]                    Marco Johnson [facility] Medical Record #[id number] Date of Birth: 1990-10-29  Referring: Martha Clan, MD Primary Care: Martha Clan, MD Primary Cardiologist: No primary care provider on file.  Chief Complaint:    Chief Complaint  Patient presents with  . Follow-up    5 month f/u on left lower rib pain/costochondral fracture     History of Present Illness:    Marco Johnson 27 y.o. male is seen in the office  today for follow-up left lower chest rib pain.  He had a follow-up CT scan limited of the chest  before the last visit..The  patient denies any known specific trauma to the area.  Patient was seen in the fall 2018 primary care complaining of some tenderness soreness in question of a knot area over the left lower rib the pain does not radiate.  CT scan and bone scan were done previously and suggested rib fracture.    He notes really no change over the last 8 months, occasionally he has some tenderness over the left lower rib, it does not appear to be enlarging and it is not noticeable most the time unless he pushes on it.   He does have a family history in his mother with a sarcoma in her leg years ago   Current Activity/ Functional Status:  Patient is independent with mobility/ambulation, transfers, ADL's, IADL's.   Zubrod Score: At the time of surgery this patient's most appropriate activity status/level should be described as: [x]     0    Normal activity, no symptoms []     1    Restricted in physical strenuous activity but ambulatory, able to do out light work []     2    Ambulatory and capable of self care, unable to do work activities, up and about               >50 % of waking hours                              []     3    Only limited self care, in bed greater than 50% of waking hours []     4    Completely disabled, no self care, confined to bed or  chair []     5    Moribund   Past Medical History:  Diagnosis Date  . Asthma   . H/O seasonal allergies     Past Surgical History:  Procedure Laterality Date  . NO PAST SURGERIES      Family History  Problem Relation Age of Onset  . Cancer Mother   . Diabetes Father     Social History   Socioeconomic History  . Marital status: Single    Spouse name: Not on file  . Number of children: Not on file  . Years of education: Not on file  . Highest education level: Not on file  Occupational History  . Occupation: Emergency planning/management officer  Social Needs  . Financial resource strain: Not on file  . Food insecurity:    Worry: Not on file    Inability: Not on file  . Transportation needs:  Medical: Not on file    Non-medical: Not on file  Tobacco Use  . Smoking status: Never Smoker  . Smokeless tobacco: Never Used  Substance and Sexual Activity  . Alcohol use: Yes  . Drug use: No  . Sexual activity: Not on file  Lifestyle  . Physical activity:    Days per week: Not on file    Minutes per session: Not on file  . Stress: Not on file  Relationships  . Social connections:    Talks on phone: Not on file    Gets together: Not on file    Attends religious service: Not on file    Active member of club or organization: Not on file    Attends meetings of clubs or organizations: Not on file    Relationship status: Not on file  . Intimate partner violence:    Fear of current or ex partner: Not on file    Emotionally abused: Not on file    Physically abused: Not on file    Forced sexual activity: Not on file  Other Topics Concern  . Not on file  Social History Narrative  . Not on file    Social History   Tobacco Use  Smoking Status Never Smoker  Smokeless Tobacco Never Used    Social History   Substance and Sexual Activity  Alcohol Use Yes     No Known Allergies  Current Outpatient Medications  Medication Sig Dispense Refill  . amphetamine-dextroamphetamine  (ADDERALL) 10 MG tablet Take 10 mg by mouth 2 (two) times daily as needed.    Marland Kitchen VYVANSE 60 MG capsule      No current facility-administered medications for this visit.     Pertinent items are noted in HPI.   Review of Systems:  Review of Systems  Constitutional: Negative.   HENT: Negative.   Eyes: Negative.   Respiratory: Negative.   Gastrointestinal: Negative.   Genitourinary: Negative.   Musculoskeletal: Negative.   Skin: Negative.   Neurological: Negative.   Endo/Heme/Allergies: Negative.   Psychiatric/Behavioral: Negative.      Immunizations: Flu up to date [  y]; Pneumococcal up to date [n  ];    PHYSICAL EXAMINATION: BP 130/76   Pulse 86   Resp 20   Ht 5\' 10"  (1.778 m)   Wt 187 lb (84.8 kg)   SpO2 98% Comment: RA  BMI 26.83 kg/m  General appearance: alert and cooperative Head: Normocephalic, without obvious abnormality, atraumatic Neck: no adenopathy, no carotid bruit, no JVD, supple, symmetrical, trachea midline and thyroid not enlarged, symmetric, no tenderness/mass/nodules Lymph nodes: Cervical, supraclavicular, and axillary nodes normal. Resp: clear to auscultation bilaterally Back: symmetric, no curvature. ROM normal. No CVA tenderness. Cardio: regular rate and rhythm, S1, S2 normal, no murmur, click, rub or gallop GI: soft, non-tender; bowel sounds normal; no masses,  no organomegaly Extremities: extremities normal, atraumatic, no cyanosis or edema and Homans sign is negative, no sign of DVT Neurologic: Grossly normal Very slight prominence of the left lower rib edge compared to the right, No predominant enlarging masses noted  Diagnostic Studies & Laboratory data:  Dg Chest 2 View  Result Date: 07/09/2018 CLINICAL DATA:  Chest pain. EXAM: CHEST - 2 VIEW COMPARISON:  Radiographs of June 27, 2017. FINDINGS: The heart size and mediastinal contours are within normal limits. Both lungs are clear. No pneumothorax or pleural effusion is noted. The  visualized skeletal structures are unremarkable. IMPRESSION: No active cardiopulmonary disease. Electronically Signed   By: Fayrene Fearing  Christen Butter, M.D.   On: 07/09/2018 10:38   Dg Ribs Unilateral Left  Result Date: 07/10/2018 CLINICAL DATA:  Left lower anterior chest wall pain 1 year. EXAM: LEFT RIBS - 2 VIEW COMPARISON:  Chest x-ray 07/09/2018 and 06/27/2017 FINDINGS: No fracture or other bone lesions are seen involving the ribs. IMPRESSION: Negative. Electronically Signed   By: Elberta Fortis M.D.   On: 07/10/2018 14:06   I have independently reviewed the above radiology studies  and reviewed the findings with the patient.     CLINICAL DATA:  27 y/o male with 3-4 weeks of left side rib pain and palpable abnormality. No known injury. Subsequent encounter.  EXAM: CT CHEST WITH CONTRAST  TECHNIQUE: Multidetector CT imaging of the chest was performed during intravenous contrast administration.  CONTRAST:  75mL ISOVUE-300 IOPAMIDOL (ISOVUE-300) INJECTION 61%  COMPARISON:  06/27/2017 chest and left rib radiographs.  FINDINGS: Cardiovascular: No significant vascular findings. Normal heart size. No pericardial effusion.  Mediastinum/Nodes: No enlarged mediastinal, hilar, or axillary lymph nodes. Thyroid gland, trachea, and esophagus demonstrate no significant findings.  Lungs/Pleura: Major airways are patent. Both lungs are clear. No pleural effusion. Incidental small right lower low 12 mm no mass cyst (series 5, image 75), normal variant).  Upper Abdomen: Circumscribed hypodense area in the right hepatic lobe on series 2, image 144 has density above that of simple fluid. This is probably a benign hemangioma. Other visible upper abdominal viscera appear normal, including in the left upper quadrant. Normal spleen. No upper abdominal free fluid.  Musculoskeletal: Palpable area of concern marked along the left lower ribs corresponding to the left sixth costochondral margin. There  is no soft tissue mass or inflammation in the region of the skin marker. However, just caudal to the skin marker there is a discontinuity at the left anterior seventh costochondral junction (series 2 image 153 and series 5, image 153) which demonstrates some dystrophic calcification and appears mildly raised such as this would likely be palpable. Still there is no inflammation in the region.  Underlying normal bone mineralization. No displaced osseous rib fracture. Intact sternum. Negative visualized spinal vertebrae.  IMPRESSION: 1. Evidence of a left anterior seventh rib costochondral cartilage fracture, appearing subacute or chronic. Legrand Rams this corresponds to the palpable area of concern. No osseous rib fracture or other regional costochondral or chest wall abnormality identified. 2. A 12 mm hypodense area in the posterior right hepatic lobe is most likely benign, such as a hemangioma. Right Upper Quadrant Ultrasound may be the simplest way to confirm. 3. Otherwise negative CT appearance of the chest.   Electronically Signed   By: Odessa Fleming M.D.   On: 07/07/2017 09:53  ADDENDUM REPORT: 06/27/2017 19:28  ADDENDUM: The third portion of the findings should read as follows: Normal appearing ribs.   Electronically Signed   By: Beckie Salts M.D.   On: 06/27/2017 19:28   Addended by Gordan Payment, MD on 06/27/2017 7:30 PM    Study Result   CLINICAL DATA:  Left anterior rib pain and deformity without known injury.  EXAM: LEFT RIBS AND CHEST - 3+ VIEW  COMPARISON:  None.  FINDINGS: Normal sized heart.  Clear lungs.  Normal appearing hips.  IMPRESSION: Normal examination.  Electronically Signed: By: Beckie Salts M.D. On: 06/27/2017 19:07      CLINICAL DATA:  27 year old male with left anterior rib pain. Abnormal chest CT demonstrating left anterior seventh rib costochondral cartilage fracture.  EXAM: NUCLEAR MEDICINE WHOLE BODY BONE  SCAN  TECHNIQUE: Whole body anterior and posterior images were obtained approximately 3 hours after intravenous injection of radiopharmaceutical.  RADIOPHARMACEUTICALS:  21.3 mCi Technetium-34m MDP IV  COMPARISON:  07/07/2017 chest CT  FINDINGS: A small focal area of increased activity in the region of the anterior left seventh rib is compatible with subacute to remote costochondral fracture as identified on recent CT.  There is minimal slight increased activity within the proximal left fibula which may represent physiologic uptake or very mild stress reaction.  No other abnormalities are noted.  IMPRESSION: 1. Small area of increased activity in the region of the anterior left seventh rib compatible with subacute to remote costochondral fracture as identified on recent CT. 2. Equivocal minimal increased activity within the proximal left fibula -question physiologic uptake or very mild stress reaction. Correlate with pain.   Electronically Signed   By: Harmon Pier M.D.   On: 08/21/2017 17:30  I have independently reviewed the above radiology studies  and reviewed the findings with the patient.     Recent Lab Findings: No results found for: WBC, HGB, HCT, PLT, GLUCOSE, CHOL, TRIG, HDL, LDLDIRECT, LDLCALC, ALT, AST, NA, K, CL, CREATININE, BUN, CO2, TSH, INR, GLUF, HGBA1C    Assessment / Plan:   Stable follow-up after fracture of costochondral junction last year, follow-up CT and x-ray showed no evidence of bony abnormality or tumor.  Initially the patient was happy to return as necessary but after talking to his family he did want to come back for follow-up visit in 8 months.   Delight Ovens MD      [address] [place] [place] [address] Office [phone]   Beeper [phone]  07/10/2018 1:51 PM

## 2018-07-10 ENCOUNTER — Ambulatory Visit
Admission: RE | Admit: 2018-07-10 | Discharge: 2018-07-10 | Disposition: A | Discharge status: Home / Self Care | Payer: Commercial Managed Care - PPO | Source: Ambulatory Visit | Attending: Cardiothoracic Surgery | Admitting: Cardiothoracic Surgery

## 2018-07-10 ENCOUNTER — Other Ambulatory Visit: Payer: Self-pay | Admitting: Cardiothoracic Surgery

## 2018-07-10 ENCOUNTER — Encounter: Payer: Self-pay | Admitting: Cardiothoracic Surgery

## 2018-07-10 DIAGNOSIS — R0789 Other chest pain: Secondary | ICD-10-CM

## 2018-07-10 DIAGNOSIS — R071 Chest pain on breathing: Principal | ICD-10-CM

## 2018-09-23 DIAGNOSIS — Z0000 Encounter for general adult medical examination without abnormal findings: Secondary | ICD-10-CM | POA: Diagnosis not present

## 2018-09-23 DIAGNOSIS — R82998 Other abnormal findings in urine: Secondary | ICD-10-CM | POA: Diagnosis not present

## 2018-09-23 DIAGNOSIS — R7301 Impaired fasting glucose: Secondary | ICD-10-CM | POA: Diagnosis not present

## 2018-09-23 DIAGNOSIS — E7849 Other hyperlipidemia: Secondary | ICD-10-CM | POA: Diagnosis not present

## 2018-09-25 DIAGNOSIS — Z23 Encounter for immunization: Secondary | ICD-10-CM | POA: Diagnosis not present

## 2018-09-25 DIAGNOSIS — E7849 Other hyperlipidemia: Secondary | ICD-10-CM | POA: Diagnosis not present

## 2018-09-25 DIAGNOSIS — Z0000 Encounter for general adult medical examination without abnormal findings: Secondary | ICD-10-CM | POA: Diagnosis not present

## 2018-09-25 DIAGNOSIS — G250 Essential tremor: Secondary | ICD-10-CM | POA: Diagnosis not present

## 2018-09-25 DIAGNOSIS — R7301 Impaired fasting glucose: Secondary | ICD-10-CM | POA: Diagnosis not present

## 2018-09-28 DIAGNOSIS — Z0000 Encounter for general adult medical examination without abnormal findings: Secondary | ICD-10-CM | POA: Diagnosis not present

## 2018-11-18 DIAGNOSIS — M545 Low back pain: Secondary | ICD-10-CM | POA: Diagnosis not present

## 2018-12-07 ENCOUNTER — Ambulatory Visit (INDEPENDENT_AMBULATORY_CARE_PROVIDER_SITE_OTHER): Payer: Commercial Managed Care - PPO

## 2018-12-07 ENCOUNTER — Ambulatory Visit: Payer: Commercial Managed Care - PPO | Admitting: Podiatry

## 2018-12-07 ENCOUNTER — Other Ambulatory Visit: Payer: Self-pay

## 2018-12-07 ENCOUNTER — Encounter: Payer: Self-pay | Admitting: Podiatry

## 2018-12-07 ENCOUNTER — Other Ambulatory Visit: Payer: Self-pay | Admitting: Podiatry

## 2018-12-07 VITALS — BP 143/93 | HR 71 | Temp 97.3°F | Resp 16

## 2018-12-07 DIAGNOSIS — M25571 Pain in right ankle and joints of right foot: Secondary | ICD-10-CM | POA: Diagnosis not present

## 2018-12-07 DIAGNOSIS — R600 Localized edema: Secondary | ICD-10-CM | POA: Diagnosis not present

## 2018-12-07 NOTE — Progress Notes (Signed)
   Subjective:    Patient ID: Marco Johnson, male    DOB: 02/08/1991, 28 y.o.   MRN: [id number]  HPI    Review of Systems  All other systems reviewed and are negative.      Objective:   Physical Exam        Assessment & Plan:

## 2018-12-10 NOTE — Progress Notes (Signed)
Subjective:   Patient ID: Marco Johnson, male   DOB: 28 y.o.   MRN: [id number]   HPI Patient presents stating he has had trouble with his right ankle after falling into a hole Saturday.  Patient states it is been very tender and it seems to hurt in the front of the leg and is having trouble bearing weight or being active.  Patient states is been ongoing and gradually more of an issue for him and he states also that is been quite swollen in the ankle.  Patient does not smoke likes to be active   Review of Systems  All other systems reviewed and are negative.       Objective:  Physical Exam Vitals signs and nursing note reviewed.  Constitutional:      Appearance: He is well-developed.  Pulmonary:     Effort: Pulmonary effort is normal.  Musculoskeletal: Normal range of motion.  Skin:    General: Skin is warm.  Neurological:     Mental Status: He is alert.     Neurovascular status intact muscle strength is adequate range of motion within normal limits with patient noted to have quite a bit of discomfort in the right lateral ankle and also the dorsum of the right lower leg with inflammation noted.  Patient is found to have good digital perfusion well oriented x3 and I did not note any other pathology except for the pain that he is experiencing but no instability noted     Assessment:  Significant ankle sprain right with inflammatory changes with possibility for bone injury or soft tissue or ligament injury     Plan:  H&P x-rays reviewed and today applied Unna boot to reduce swelling to leave on 3 days instructed him to take it off earlier if any digital problems should occur and I also went ahead and I applied air fracture walker to completely immobilize and take pressure off the foot and lower leg.  Patient will be seen back to recheck 2 weeks or earlier if needed  X-ray indicates no signs of diastases injury or fracture of the ankle currently

## 2018-12-21 ENCOUNTER — Encounter: Payer: Self-pay | Admitting: Podiatry

## 2018-12-21 ENCOUNTER — Ambulatory Visit: Payer: Commercial Managed Care - PPO | Admitting: Podiatry

## 2018-12-21 ENCOUNTER — Other Ambulatory Visit: Payer: Self-pay

## 2018-12-21 VITALS — Temp 97.7°F

## 2018-12-21 DIAGNOSIS — M25571 Pain in right ankle and joints of right foot: Secondary | ICD-10-CM

## 2018-12-21 DIAGNOSIS — R600 Localized edema: Secondary | ICD-10-CM

## 2018-12-21 NOTE — Progress Notes (Signed)
Subjective:   Patient ID: Marco Johnson, male   DOB: 28 y.o.   MRN: [id number]   HPI Patient presents stating my ankles feeling quite a bit better with mild discomfort and wearing a brace that I had from several years ago   ROS      Objective:  Physical Exam  Neurovascular status intact with patient's ankle relatively strong right with mild edema and slight excessive inversion but nothing significant with no feeling of instability with gait     Assessment:  Appears to be a mild ankle sprain right that does appear to be healing with boot having been of great help for patient     Plan:  Reviewed with patient different exercises to do to try to strengthen the ankle and to increase proprioception.  Patient's discharge will be seen back to recheck

## 2019-03-18 ENCOUNTER — Ambulatory Visit: Payer: Commercial Managed Care - PPO | Admitting: Cardiothoracic Surgery

## 2019-04-15 ENCOUNTER — Ambulatory Visit: Payer: Commercial Managed Care - PPO | Admitting: Cardiothoracic Surgery

## 2019-04-29 IMAGING — CR DG RIBS 2V*L*
2 series · 2 of 2 positions shown · non-contrast
Comparison: Chest x-ray 07/09/2018 and 06/27/2017

CLINICAL DATA: Left lower anterior chest wall pain 1 year.

EXAM:
LEFT RIBS - 2 VIEW

[w ribs ap/pa upper left *]
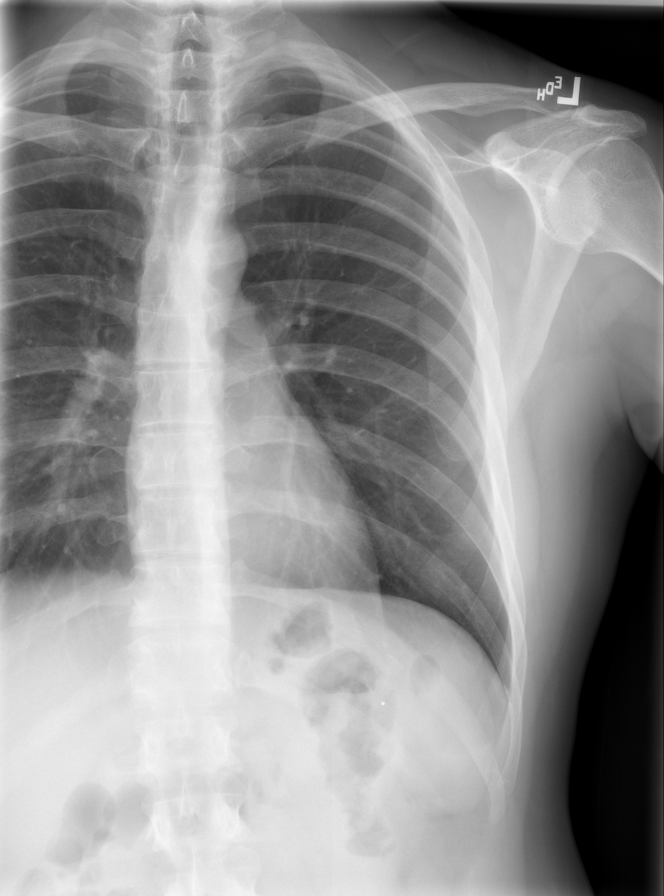

[w ribs oblique left *]
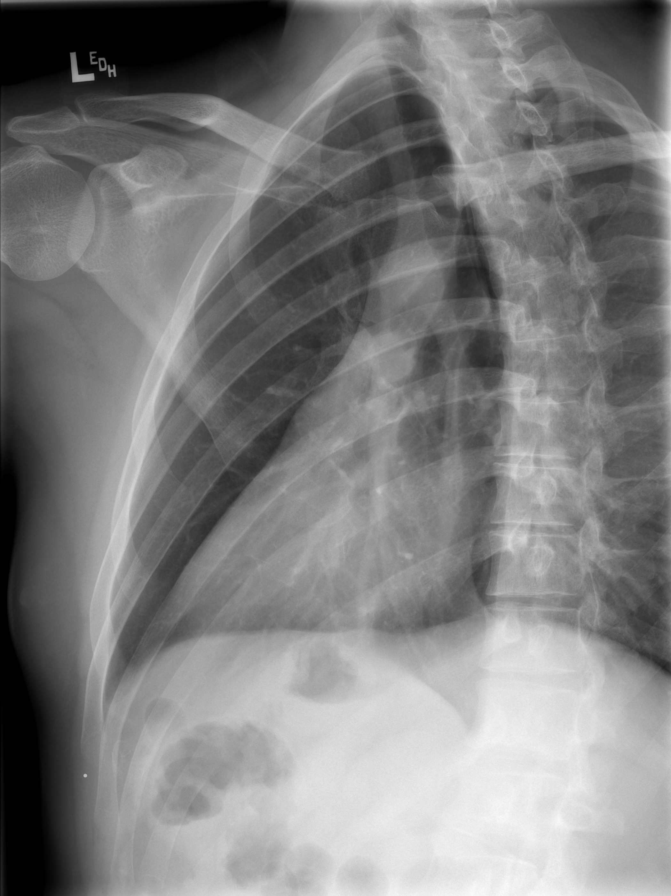

[2 of 2 positions shown; findings below may reference images not displayed]

FINDINGS: No fracture or other bone lesions are seen involving the ribs.
IMPRESSION: Negative.

## 2019-05-06 ENCOUNTER — Other Ambulatory Visit: Payer: Self-pay

## 2019-05-06 DIAGNOSIS — Z20822 Contact with and (suspected) exposure to covid-19: Secondary | ICD-10-CM

## 2019-05-07 LAB — NOVEL CORONAVIRUS, NAA: SARS-CoV-2, NAA: NOT DETECTED

## 2020-07-03 ENCOUNTER — Ambulatory Visit: Payer: Commercial Managed Care - PPO | Attending: Internal Medicine

## 2020-07-03 DIAGNOSIS — Z23 Encounter for immunization: Secondary | ICD-10-CM

## 2020-07-03 NOTE — Progress Notes (Signed)
   Covid-[facility] Vaccination [facility]  Name:  Marco Johnson    MRN: [id number] DOB: 12/12/1990  07/03/2020  [name] was observed post Covid-19 immunization for 15 minutes without incident. He was provided with Vaccine Information Sheet and instruction to access the V-Safe system.   [name] was instructed to call [phone] with any severe reactions post vaccine: Marland Kitchen Difficulty breathing  . Swelling of face and throat  . A fast heartbeat  . A bad rash all over body  . Dizziness and weakness
